# Patient Record
Sex: Male | Born: 1965
Health system: Southern US, Community
[De-identification: ages and names within clinical notes are randomized; demographics above are authoritative.]

## PROBLEM LIST (undated history)

## (undated) DIAGNOSIS — K219 Gastro-esophageal reflux disease without esophagitis: Secondary | ICD-10-CM

## (undated) DIAGNOSIS — R0789 Other chest pain: Secondary | ICD-10-CM

## (undated) HISTORY — PX: OTHER SURGICAL HISTORY: SHX169

## (undated) HISTORY — DX: Other chest pain: R07.89

---

## 2007-03-26 ENCOUNTER — Ambulatory Visit: Payer: Self-pay | Admitting: Internal Medicine

## 2009-11-24 ENCOUNTER — Emergency Department: Payer: Self-pay

## 2009-11-29 ENCOUNTER — Ambulatory Visit: Payer: Self-pay | Admitting: General Practice

## 2012-08-19 ENCOUNTER — Emergency Department: Payer: Self-pay | Admitting: Emergency Medicine

## 2012-08-19 LAB — CBC
HCT: 42.4 % (ref 40.0–52.0)
MCH: 29 pg (ref 26.0–34.0)
MCHC: 34.8 g/dL (ref 32.0–36.0)
MCV: 83 fL (ref 80–100)
Platelet: 210 10*3/uL (ref 150–440)
RBC: 5.09 10*6/uL (ref 4.40–5.90)

## 2012-08-19 LAB — URINALYSIS, COMPLETE
Bilirubin,UR: NEGATIVE
Glucose,UR: NEGATIVE mg/dL (ref 0–75)
Ketone: NEGATIVE
Leukocyte Esterase: NEGATIVE
Nitrite: NEGATIVE
Ph: 6 (ref 4.5–8.0)
Squamous Epithelial: <1

## 2012-08-19 LAB — BASIC METABOLIC PANEL
BUN: 15 mg/dL (ref 7–18)
Creatinine: 1.2 mg/dL (ref 0.60–1.30)
EGFR (Non-African Amer.): >60
Osmolality: 270 (ref 275–301)
Potassium: 3.1 mmol/L — ABNORMAL LOW (ref 3.5–5.1)
Sodium: 134 mmol/L — ABNORMAL LOW (ref 136–145)

## 2012-08-19 LAB — HEPATIC FUNCTION PANEL A (ARMC)
Bilirubin,Total: 0.5 mg/dL (ref 0.2–1.0)
SGOT(AST): 46 U/L — ABNORMAL HIGH (ref 15–37)
SGPT (ALT): 70 U/L (ref 12–78)
Total Protein: 7.8 g/dL (ref 6.4–8.2)

## 2012-08-19 LAB — LIPASE, BLOOD: Lipase: 64 U/L — ABNORMAL LOW (ref 73–393)

## 2012-10-15 ENCOUNTER — Ambulatory Visit: Payer: Self-pay | Admitting: Gastroenterology

## 2017-02-28 DIAGNOSIS — J069 Acute upper respiratory infection, unspecified: Secondary | ICD-10-CM | POA: Diagnosis not present

## 2017-03-17 DIAGNOSIS — B9689 Other specified bacterial agents as the cause of diseases classified elsewhere: Secondary | ICD-10-CM | POA: Diagnosis not present

## 2017-03-17 DIAGNOSIS — J019 Acute sinusitis, unspecified: Secondary | ICD-10-CM | POA: Diagnosis not present

## 2018-02-21 ENCOUNTER — Emergency Department: Payer: 59

## 2018-02-21 ENCOUNTER — Other Ambulatory Visit: Payer: Self-pay

## 2018-02-21 ENCOUNTER — Observation Stay
Admission: EM | Admit: 2018-02-21 | Discharge: 2018-02-22 | Disposition: A | Discharge status: Home / Self Care | Payer: 59 | Attending: Internal Medicine | Admitting: Internal Medicine

## 2018-02-21 ENCOUNTER — Encounter: Payer: Self-pay | Admitting: Emergency Medicine

## 2018-02-21 DIAGNOSIS — H538 Other visual disturbances: Secondary | ICD-10-CM | POA: Diagnosis not present

## 2018-02-21 DIAGNOSIS — H8110 Benign paroxysmal vertigo, unspecified ear: Secondary | ICD-10-CM | POA: Insufficient documentation

## 2018-02-21 DIAGNOSIS — R42 Dizziness and giddiness: Secondary | ICD-10-CM | POA: Diagnosis not present

## 2018-02-21 DIAGNOSIS — R0789 Other chest pain: Secondary | ICD-10-CM | POA: Diagnosis not present

## 2018-02-21 DIAGNOSIS — R079 Chest pain, unspecified: Secondary | ICD-10-CM | POA: Diagnosis present

## 2018-02-21 DIAGNOSIS — K219 Gastro-esophageal reflux disease without esophagitis: Secondary | ICD-10-CM | POA: Insufficient documentation

## 2018-02-21 DIAGNOSIS — I498 Other specified cardiac arrhythmias: Secondary | ICD-10-CM | POA: Insufficient documentation

## 2018-02-21 DIAGNOSIS — Z79899 Other long term (current) drug therapy: Secondary | ICD-10-CM | POA: Diagnosis not present

## 2018-02-21 HISTORY — DX: Gastro-esophageal reflux disease without esophagitis: K21.9

## 2018-02-21 LAB — BASIC METABOLIC PANEL
Anion gap: 7 (ref 5–15)
BUN: 14 mg/dL (ref 6–20)
CO2: 25 mmol/L (ref 22–32)
Calcium: 8.8 mg/dL — ABNORMAL LOW (ref 8.9–10.3)
Chloride: 106 mmol/L (ref 98–111)
Creatinine, Ser: 0.82 mg/dL (ref 0.61–1.24)
GFR calc Af Amer: >60 mL/min (ref >60)
GFR calc non Af Amer: >60 mL/min (ref >60)
GLUCOSE: 102 mg/dL — AB (ref 70–99)
Potassium: 4 mmol/L (ref 3.5–5.1)
Sodium: 138 mmol/L (ref 135–145)

## 2018-02-21 LAB — CBC
HCT: 45.5 % (ref 39.0–52.0)
Hemoglobin: 15.2 g/dL (ref 13.0–17.0)
MCH: 29 pg (ref 26.0–34.0)
MCHC: 33.4 g/dL (ref 30.0–36.0)
MCV: 86.8 fL (ref 80.0–100.0)
Platelets: 270 10*3/uL (ref 150–400)
RBC: 5.24 MIL/uL (ref 4.22–5.81)
RDW: 13.4 % (ref 11.5–15.5)
WBC: 6.7 10*3/uL (ref 4.0–10.5)
nRBC: 0 % (ref 0.0–0.2)

## 2018-02-21 LAB — DIFFERENTIAL
Abs Immature Granulocytes: 0.02 10*3/uL (ref 0.00–0.07)
Basophils Absolute: 0.1 10*3/uL (ref 0.0–0.1)
Basophils Relative: 1 %
Eosinophils Absolute: 0.3 10*3/uL (ref 0.0–0.5)
Eosinophils Relative: 4 %
Immature Granulocytes: 0 %
LYMPHS ABS: 2.1 10*3/uL (ref 0.7–4.0)
Lymphocytes Relative: 32 %
Monocytes Absolute: 0.4 10*3/uL (ref 0.1–1.0)
Monocytes Relative: 6 %
Neutro Abs: 3.9 10*3/uL (ref 1.7–7.7)
Neutrophils Relative %: 57 %

## 2018-02-21 LAB — TROPONIN I: Troponin I: <0.03 ng/mL (ref <0.03)

## 2018-02-21 LAB — FIBRIN DERIVATIVES D-DIMER (ARMC ONLY): Fibrin derivatives D-dimer (ARMC): 111.29 ng/mL (FEU) (ref 0.00–499.00)

## 2018-02-21 LAB — PROTIME-INR
INR: 0.95
Prothrombin Time: 12.6 seconds (ref 11.4–15.2)

## 2018-02-21 LAB — APTT: aPTT: 35 seconds (ref 24–36)

## 2018-02-21 LAB — GLUCOSE, CAPILLARY: Glucose-Capillary: 80 mg/dL (ref 70–99)

## 2018-02-21 MED ORDER — MORPHINE SULFATE (PF) 2 MG/ML IV SOLN: 2.0000 mg | INTRAVENOUS | Status: DC | PRN

## 2018-02-21 MED ORDER — ACETAMINOPHEN 325 MG PO TABS: 650.0000 mg | ORAL_TABLET | ORAL | Status: DC | PRN

## 2018-02-21 MED ORDER — ONDANSETRON HCL 4 MG/2ML IJ SOLN: 4.0000 mg | Freq: Four times a day (QID) | INTRAMUSCULAR | Status: DC | PRN

## 2018-02-21 MED ORDER — ASPIRIN 81 MG PO CHEW
324.0000 mg | CHEWABLE_TABLET | Freq: Once | ORAL | Status: AC
  Administered 2018-02-21: 324 mg via ORAL
  Filled 2018-02-21: qty 4

## 2018-02-21 MED ORDER — ENOXAPARIN SODIUM 40 MG/0.4ML ~~LOC~~ SOLN: 40.0000 mg | SUBCUTANEOUS | Status: DC

## 2018-02-21 MED ORDER — PANTOPRAZOLE SODIUM 40 MG PO TBEC: 40.0000 mg | DELAYED_RELEASE_TABLET | Freq: Every day | ORAL | Status: DC

## 2018-02-21 NOTE — Progress Notes (Signed)
Family Meeting Note  Advance Directive:yes  Today a meeting took place with the Patient.     The following clinical team members were present during this meeting:MD  The following were discussed:Patient's diagnosis: 3-day history of chest pain associated with dizziness, GERD, will be admitted to the hospital.  Will check orthostatics and patient will be monitored on telemetry.  The plan of care discussed in detail with the patient he verbalized understanding of the plan.  Wife at bedside.     Patient's progosis: > 12 months and Goals for treatment: Full Code  Wife healthcare power of attorney  Additional follow-up to be provided: Hospitalist and cardiology  Time spent during discussion:17 min  Ramonita Lab, MD

## 2018-02-21 NOTE — ED Triage Notes (Signed)
Pt in via POV with complaints of generalized chest pressure x 3 days, developing some associated dizziness last night and again today.  Pt also reports some blurred vision upon waking this morning which has not resolved.  Pt othewise neurologically intact.  Denies hx of vertigo.  Vitals WDL.

## 2018-02-21 NOTE — H&P (Addendum)
[facility] - [place] at [facility]   PATIENT NAME: Russell Flynn    MR#:  [id number]  DATE OF BIRTH:  May 28, 1965  DATE OF ADMISSION:  02/21/2018  PRIMARY CARE PHYSICIAN: Lauro Regulus, MD   REQUESTING/REFERRING PHYSICIAN: Rockne Menghini, MD  CHIEF COMPLAINT:  Chest pain  HISTORY OF PRESENT ILLNESS:  Russell Flynn  is a 53 y.o. male with a known history of chest pain for the past 3 days.  Has history of GERD and takes omeprazole twice a day.  Patient reports the pain in the chest started 3 days ago on the left side and radiating to the right side feels like pressure but not associated with any shortness of breath but feels dizzy denies any syncopal episodes.  No sick contacts.  Initial troponin less than 0.03.  CT head is negative chest x-ray negative.  Hospitalist team is called admit the patient.  Wife at bedside  PAST MEDICAL HISTORY:  GERD  PAST SURGICAL HISTOIRY:  History reviewed. No pertinent surgical history.  SOCIAL HISTORY:   Social History   Tobacco Use  . Smoking status: Never Smoker  . Smokeless tobacco: Never Used  Substance Use Topics  . Alcohol use: Never    Frequency: Never    FAMILY HISTORY:  No family history on file.  DRUG ALLERGIES:  No Known Allergies  REVIEW OF SYSTEMS:  CONSTITUTIONAL: No fever, fatigue or weakness.  EYES: No blurred or double vision.  EARS, NOSE, AND THROAT: No tinnitus or ear pain.  RESPIRATORY: No cough, shortness of breath, wheezing or hemoptysis.  CARDIOVASCULAR: Reports 3-day history of left-sided chest pain, dizziness, denies orthopnea, edema.  GASTROINTESTINAL: No nausea, vomiting, diarrhea or abdominal pain.  GENITOURINARY: No dysuria, hematuria.  ENDOCRINE: No polyuria, nocturia,  HEMATOLOGY: No anemia, easy bruising or bleeding SKIN: No rash or lesion. MUSCULOSKELETAL: No joint pain or arthritis.   NEUROLOGIC: No tingling, numbness, weakness.  PSYCHIATRY: No anxiety or  depression.   MEDICATIONS AT HOME:   Prior to Admission medications   Not on File      VITAL SIGNS:  Blood pressure (!) 132/114, pulse 61, temperature 98.1 F (36.7 C), temperature source Oral, resp. rate 16, height 5\' 3"  (1.6 m), weight 64.4 kg, SpO2 100 %.  PHYSICAL EXAMINATION:  GENERAL:  54 y.o.-year-old patient lying in the bed with no acute distress.  EYES: Pupils equal, round, reactive to light and accommodation. No scleral icterus. Extraocular muscles intact.  HEENT: Head atraumatic, normocephalic. Oropharynx and nasopharynx clear.  NECK:  Supple, no jugular venous distention. No thyroid enlargement, no tenderness.  LUNGS: Normal breath sounds bilaterally, no wheezing, rales,rhonchi or crepitation. No use of accessory muscles of respiration.  CARDIOVASCULAR: S1, S2 normal. No murmurs, rubs, or gallops.  No reproducible chest tenderness on palpation ABDOMEN: Soft, nontender, nondistended. Bowel sounds present.   EXTREMITIES: No pedal edema, cyanosis, or clubbing.  NEUROLOGIC: Cranial nerves II through XII are intact. Muscle strength 5/5 in all extremities. Sensation intact. Gait not checked.  PSYCHIATRIC: The patient is alert and oriented x 3.  SKIN: No obvious rash, lesion, or ulcer.   LABORATORY PANEL:   CBC Recent Labs  Lab 02/21/18 1340  WBC 6.7  HGB 15.2  HCT 45.5  PLT 270   ------------------------------------------------------------------------------------------------------------------  Chemistries  Recent Labs  Lab 02/21/18 1340  NA 138  K 4.0  CL 106  CO2 25  GLUCOSE 102*  BUN 14  CREATININE 0.82  CALCIUM 8.8*   ------------------------------------------------------------------------------------------------------------------  Cardiac Enzymes Recent  Labs  Lab 02/21/18 1717  TROPONINI <0.03   ------------------------------------------------------------------------------------------------------------------  RADIOLOGY:  Dg Chest 2  View  Result Date: 02/21/2018 CLINICAL DATA:  Patient with complaints of generalized chest pressure x 3 days, developing some associated dizziness last night and again today. Pt also reports some blurred vision upon waking this morning which has not resolved. EXAM: CHEST - 2 VIEW COMPARISON:  None. FINDINGS: The heart size and mediastinal contours are within normal limits. Both lungs are clear. The visualized skeletal structures are unremarkable. IMPRESSION: No active cardiopulmonary disease. Electronically Signed   By: Norva Pavlov M.D.   On: 02/21/2018 14:44   Ct Head Wo Contrast  Result Date: 02/21/2018 CLINICAL DATA:  53 y/o M; dizziness and bilateral blurred vision since last night. EXAM: CT HEAD WITHOUT CONTRAST TECHNIQUE: Contiguous axial images were obtained from the base of the skull through the vertex without intravenous contrast. COMPARISON:  None. FINDINGS: Brain: No evidence of acute infarction, hemorrhage, hydrocephalus, extra-axial collection or mass lesion/mass effect. Vascular: No hyperdense vessel or unexpected calcification. Skull: Normal. Negative for fracture or focal lesion. Sinuses/Orbits: No acute finding. Other: None. IMPRESSION: No acute intracranial abnormality identified. Unremarkable CT of the head. Electronically Signed   By: Mitzi Hansen M.D.   On: 02/21/2018 14:42    EKG:   Orders placed or performed during the hospital encounter of 02/21/18  . EKG 12-Lead  . EKG 12-Lead  . ED EKG  . ED EKG    IMPRESSION AND PLAN:    #Chest pain for 3 days Admitted telemetry Cycle cardiac biomarkers Aspirin once daily, nitroglycerin as needed, morphine for severe pain Oxygen via nasal cannula to keep pulse ox greater than 93% Monitor patient on telemetry Check fasting lipid panel We will start low-dose Toprol if blood pressure is stable at this point patient's blood pressure is very soft   #Dizziness Need orthostatic blood pressures and gentle hydration  with IV fluids  #GERD PPI  DVT prophylaxis with IV Lovenox  All the records are reviewed and case discussed with ED provider. Management plans discussed with the patient, family and they are in agreement.  CODE STATUS: fc   TOTAL TIME TAKING CARE OF THIS PATIENT: 39   minutes.   Note: This dictation was prepared with Dragon dictation along with smaller phrase technology. Any transcriptional errors that result from this process are unintentional.  Ramonita Lab M.D on 02/21/2018 at 7:13 PM  Between 7am to 6pm - Pager - [phone]  After 6pm go to [email or web address] - password [email or web address] [facility]  [place] [facility]  Office  [phone]  CC: Primary care physician; Lauro Regulus, MD

## 2018-02-21 NOTE — ED Provider Notes (Signed)
[facility] Emergency Department Provider Note  ____________________________________________  Time seen: Approximately 5:16 PM  I have reviewed the triage vital signs and the nursing notes.   HISTORY  Chief Complaint Chest Pain    HPI BURLEN MACARI is a 53 y.o. male, otherwise healthy, presenting w/ exertional CP.  The pt reports that for the past 3d, he has been having a L sided, nonradiating chest pressure that only occurs with exertion and resolves with rest.  He has no associated diaphoresis, sob, n/v, palpitations.  He does feel lightheaded w/o syncope when he has the pain.  He has mild pain w/ deep breaths.  Episodes usually last 2 minutes.  No cough/cold sx's.  No fever/chills.  Never had risk stratification study or cath.  Denies tobacco or cocaine.  No FH of early CAD or blood clots.    History reviewed. No pertinent past medical history.  There are no active problems to display for this patient.   History reviewed. No pertinent surgical history.    Allergies Patient has no known allergies.  No family history on file.  Social History Social History   Tobacco Use  . Smoking status: Never Smoker  . Smokeless tobacco: Never Used  Substance Use Topics  . Alcohol use: Never    Frequency: Never  . Drug use: Never    Review of Systems Constitutional: No fever/chills. + lightheadedness w/o syncope. Eyes: No visual changes. ENT: No sore throat. No congestion or rhinorrhea. Cardiovascular: + chest pain. Denies palpitations. Respiratory: Denies shortness of breath.  No cough. Gastrointestinal: No abdominal pain.  No nausea, no vomiting.  No diarrhea.  No constipation. Genitourinary: Negative for dysuria. Musculoskeletal: Negative for back pain. No LE swelling or calf pain. Skin: Negative for rash. Neurological: Negative for headaches. No focal numbness, tingling or weakness.      ____________________________________________   PHYSICAL EXAM:  VITAL SIGNS: ED Triage Vitals  Enc Vitals Group     BP 02/21/18 1331 108/80     Pulse Rate 02/21/18 1331 77     Resp 02/21/18 1331 16     Temp 02/21/18 1331 98.1 F (36.7 C)     Temp Source 02/21/18 1331 Oral     SpO2 02/21/18 1331 98 %     Weight 02/21/18 1332 142 lb (64.4 kg)     Height 02/21/18 1332 5\' 3"  (1.6 m)     Head Circumference --      Peak Flow --      Pain Score 02/21/18 1331 4     Pain Loc --      Pain Edu? --      Excl. in GC? --     Constitutional: Alert and oriented. Answers questions appropriately. Eyes: Conjunctivae are normal.  EOMI. No scleral icterus. Head: Atraumatic. Nose: No congestion/rhinnorhea. Mouth/Throat: Mucous membranes are moist.  Neck: No stridor.  Supple.  No JVD, no meningismus. Cardiovascular: Normal rate, regular rhythm. No murmurs, rubs or gallops.  Respiratory: Normal respiratory effort.  No accessory muscle use or retractions. Lungs CTAB.  No wheezes, rales or ronchi. Gastrointestinal: Soft, nontender and nondistended.  No guarding or rebound.  No peritoneal signs. Musculoskeletal: No LE edema. No ttp in the calves or palpable cords.  Negative Homan's sign. Neurologic:  A&Ox3.  Speech is clear.  Face and smile are symmetric.  EOMI.  Moves all extremities well. Skin:  Skin is warm, dry and intact. No rash noted. Psychiatric: Mood and affect are normal. Speech and behavior are  normal.  Normal judgement  ____________________________________________   LABS (all labs ordered are listed, but only abnormal results are displayed)  Labs Reviewed  BASIC METABOLIC PANEL - Abnormal; Notable for the following components:      Result Value   Glucose, Bld 102 (*)    Calcium 8.8 (*)    All other components within normal limits  CBC  TROPONIN I  PROTIME-INR  APTT  DIFFERENTIAL  GLUCOSE, CAPILLARY  TROPONIN I  FIBRIN DERIVATIVES D-DIMER ([facility] ONLY)    ____________________________________________  EKG  This EKG is performed at 1323 and interpreted by me; rate 70, NSR, nl intervals, no STEMI, nonspecific twave inversion V1. ____________________________________________  RADIOLOGY  Dg Chest 2 View  Result Date: 02/21/2018 CLINICAL DATA:  Patient with complaints of generalized chest pressure x 3 days, developing some associated dizziness last night and again today. Pt also reports some blurred vision upon waking this morning which has not resolved. EXAM: CHEST - 2 VIEW COMPARISON:  None. FINDINGS: The heart size and mediastinal contours are within normal limits. Both lungs are clear. The visualized skeletal structures are unremarkable. IMPRESSION: No active cardiopulmonary disease. Electronically Signed   By: Norva Pavlov M.D.   On: 02/21/2018 14:44   Ct Head Wo Contrast  Result Date: 02/21/2018 CLINICAL DATA:  53 y/o M; dizziness and bilateral blurred vision since last night. EXAM: CT HEAD WITHOUT CONTRAST TECHNIQUE: Contiguous axial images were obtained from the base of the skull through the vertex without intravenous contrast. COMPARISON:  None. FINDINGS: Brain: No evidence of acute infarction, hemorrhage, hydrocephalus, extra-axial collection or mass lesion/mass effect. Vascular: No hyperdense vessel or unexpected calcification. Skull: Normal. Negative for fracture or focal lesion. Sinuses/Orbits: No acute finding. Other: None. IMPRESSION: No acute intracranial abnormality identified. Unremarkable CT of the head. Electronically Signed   By: Mitzi Hansen M.D.   On: 02/21/2018 14:42    ____________________________________________   PROCEDURES  Procedure(s) performed: None  Procedures  Critical Care performed: No ____________________________________________   INITIAL IMPRESSION / ASSESSMENT AND PLAN / ED COURSE  Pertinent labs & imaging results that were available during my care of the patient were reviewed by me  and considered in my medical decision making (see chart for details).  53 y.o. M otherwise healthy presenting w/ exertional CP w/ lightheadedness for 3d.  Overall, the pt is hemodynamically stable with a reassuring EKG w/o ischemia or arrhythmia and a negative troponin.  However, his history is concerning for exertional angina/unstable angina and the patient has never had a risk stratification study.  A ddimer is pending.  The pt will receive asa, heparin is not indicated as the pt is chest pain free at this time.  I have spoken with the cardiologist on call, who recommends admission for inpatient stress test.  The patient is in agreement with the plan.     ____________________________________________  FINAL CLINICAL IMPRESSION(S) / ED DIAGNOSES  Final diagnoses:  Exertional chest pain  Lightheadedness         NEW MEDICATIONS STARTED DURING THIS VISIT:  New Prescriptions   No medications on file      Rockne Menghini, MD 02/21/18 1728

## 2018-02-21 NOTE — ED Notes (Signed)
ED TO INPATIENT HANDOFF REPORT  Name/Age/Gender Russell Flynn 53 y.o. male  Code Status   Home/SNF/Other Patient from home.   Chief Complaint chest pain, dizziness from [employer]  Level of Care/Admitting Diagnosis ED Disposition    ED Disposition Condition Comment   Admit  Hospital Area: [facility] [[id number]]  Level of Care: Telemetry [5]  Diagnosis: Chest pain [161096][[id number]]  Admitting Physician: Ramonita LabGOURU, ARUNA [[id number]]  Attending Physician: Ramonita LabGOURU, ARUNA [[id number]]  Bed request comments: 2a  PT Class (Do Not Modify): Observation [104]  PT Acc Code (Do Not Modify): Observation [10022]       Medical History History reviewed. No pertinent past medical history.  Allergies No Known Allergies  IV Location/Drains/Wounds Patient Lines/Drains/Airways Status   Active Line/Drains/Airways    Name:   Placement date:   Placement time:   Site:   Days:   Peripheral IV 02/21/18 Left Antecubital   02/21/18    1720    Antecubital   less than 1          Labs/Imaging Results for orders placed or performed during the hospital encounter of 02/21/18 (from the past 48 hour(s))  Basic metabolic panel     Status: Abnormal   Collection Time: 02/21/18  1:40 PM  Result Value Ref Range   Sodium 138 135 - 145 mmol/L   Potassium 4.0 3.5 - 5.1 mmol/L   Chloride 106 98 - 111 mmol/L   CO2 25 22 - 32 mmol/L   Glucose, Bld 102 (H) 70 - 99 mg/dL   BUN 14 6 - 20 mg/dL   Creatinine, Ser 0.450.82 0.61 - 1.24 mg/dL   Calcium 8.8 (L) 8.9 - 10.3 mg/dL   GFR calc non Af Amer >60 >60 mL/min   GFR calc Af Amer >60 >60 mL/min   Anion gap 7 5 - 15    Comment: Performed at [facility], [address] [place], [place] [address]  CBC     Status: None   Collection Time: 02/21/18  1:40 PM  Result Value Ref Range   WBC 6.7 4.0 - 10.5 K/uL   RBC 5.24 4.22 - 5.81 MIL/uL   Hemoglobin 15.2 13.0 - 17.0 g/dL   HCT 19.145.5 47.839.0 - 29.552.0 %   MCV 86.8 80.0 - 100.0 fL   MCH 29.0 26.0 - 34.0 pg   MCHC  33.4 30.0 - 36.0 g/dL   RDW 62.113.4 30.811.5 - 65.715.5 %   Platelets 270 150 - 400 K/uL   nRBC 0.0 0.0 - 0.2 %    Comment: Performed at [facility], [address] [place], [place] [address]  Troponin I - ONCE - STAT     Status: None   Collection Time: 02/21/18  1:40 PM  Result Value Ref Range   Troponin I <0.03 <0.03 ng/mL    Comment: Performed at [facility], [address] [place], [place] [address]  Protime-INR     Status: None   Collection Time: 02/21/18  1:40 PM  Result Value Ref Range   Prothrombin Time 12.6 11.4 - 15.2 seconds   INR 0.95     Comment: Performed at [facility], [address] [place], [place] [address]  APTT     Status: None   Collection Time: 02/21/18  1:40 PM  Result Value Ref Range   aPTT 35 24 - 36 seconds    Comment: Performed at [facility], [address] [place], [place] [address]  Differential  Status: None   Collection Time: 02/21/18  1:40 PM  Result Value Ref Range   Neutrophils Relative % 57 %   Neutro Abs 3.9 1.7 - 7.7 K/uL   Lymphocytes Relative 32 %   Lymphs Abs 2.1 0.7 - 4.0 K/uL   Monocytes Relative 6 %   Monocytes Absolute 0.4 0.1 - 1.0 K/uL   Eosinophils Relative 4 %   Eosinophils Absolute 0.3 0.0 - 0.5 K/uL   Basophils Relative 1 %   Basophils Absolute 0.1 0.0 - 0.1 K/uL   Immature Granulocytes 0 %   Abs Immature Granulocytes 0.02 0.00 - 0.07 K/uL    Comment: Performed at [facility], [address] [place], [place] [address]  Glucose, capillary     Status: None   Collection Time: 02/21/18  1:43 PM  Result Value Ref Range   Glucose-Capillary 80 70 - 99 mg/dL  Troponin I - ONCE - STAT     Status: None   Collection Time: 02/21/18  5:17 PM  Result Value Ref Range   Troponin I <0.03 <0.03 ng/mL    Comment: Performed at [facility], [address] [place], [place] [address]  Fibrin derivatives D-Dimer [facility] only)     Status: None   Collection Time: 02/21/18  5:21 PM   Result Value Ref Range   Fibrin derivatives D-dimer ([facility]) 111.29 0.00 - 499.00 ng/mL (FEU)    Comment: (NOTE) <> Exclusion of Venous Thromboembolism (VTE) - OUTPATIENT ONLY   (Emergency Department or [place])   0-499 ng/ml (FEU): With a low to intermediate pretest probability                      for VTE this test result excludes the diagnosis                      of VTE.   >499 ng/ml (FEU) : VTE not excluded; additional work up for VTE is                      required. <> Testing on Inpatients and Evaluation of Disseminated Intravascular   Coagulation (DIC) Reference Range:   0-499 ng/ml (FEU) Performed at [facility], [address] [place], [place] [address]    Dg Chest 2 View  Result Date: 02/21/2018 CLINICAL DATA:  Patient with complaints of generalized chest pressure x 3 days, developing some associated dizziness last night and again today. Pt also reports some blurred vision upon waking this morning which has not resolved. EXAM: CHEST - 2 VIEW COMPARISON:  None. FINDINGS: The heart size and mediastinal contours are within normal limits. Both lungs are clear. The visualized skeletal structures are unremarkable. IMPRESSION: No active cardiopulmonary disease. Electronically Signed   By: Norva Pavlov M.D.   On: 02/21/2018 14:44   Ct Head Wo Contrast  Result Date: 02/21/2018 CLINICAL DATA:  53 y/o M; dizziness and bilateral blurred vision since last night. EXAM: CT HEAD WITHOUT CONTRAST TECHNIQUE: Contiguous axial images were obtained from the base of the skull through the vertex without intravenous contrast. COMPARISON:  None. FINDINGS: Brain: No evidence of acute infarction, hemorrhage, hydrocephalus, extra-axial collection or mass lesion/mass effect. Vascular: No hyperdense vessel or unexpected calcification. Skull: Normal. Negative for fracture or focal lesion. Sinuses/Orbits: No acute finding. Other: None. IMPRESSION: No acute intracranial abnormality identified.  Unremarkable CT of the head. Electronically Signed   By: Mitzi Hansen M.D.   On: 02/21/2018 14:42  Pending Labs [employer] (From admission, onward)    Start     Ordered   Signed and Held  HIV antibody (Routine Testing)  Once,   R     Signed and Held   Signed and Held  Troponin I - Now Then Q3H  Now then every 3 hours,   TIMED     Signed and Held   Signed and Held  CBC  (enoxaparin (LOVENOX)    CrCl >/= 30 ml/min)  Once,   R    Comments:  Baseline for enoxaparin therapy IF NOT ALREADY DRAWN.  Notify MD if PLT < 100 K.    Signed and Held   Signed and Held  Creatinine, serum  (enoxaparin (LOVENOX)    CrCl >/= 30 ml/min)  Once,   R    Comments:  Baseline for enoxaparin therapy IF NOT ALREADY DRAWN.    Signed and Held   Signed and Held  Creatinine, serum  (enoxaparin (LOVENOX)    CrCl >/= 30 ml/min)  Weekly,   R    Comments:  while on enoxaparin therapy    Signed and Held          Vitals/Pain Today's Vitals   02/21/18 1332 02/21/18 1711 02/21/18 1929 02/21/18 2305  BP:  (!) 132/114 106/79 (!) 110/58  Pulse:  61 74 68  Resp:  16 20 17   Temp:   98.9 F (37.2 C) 98.6 F (37 C)  TempSrc:   Oral Oral  SpO2:  100% 100% 99%  Weight: 64.4 kg     Height: 5\' 3"  (1.6 m)     PainSc:  0-No pain 0-No pain 0-No pain    Isolation Precautions No active isolations  Medications Medications  aspirin chewable tablet 324 mg (324 mg Oral Given 02/21/18 1737)    Mobility Ambulatory

## 2018-02-21 NOTE — ED Notes (Signed)
Report called and given to receiving nurse.  

## 2018-02-22 ENCOUNTER — Encounter: Payer: Self-pay | Admitting: Internal Medicine

## 2018-02-22 ENCOUNTER — Observation Stay (HOSPITAL_BASED_OUTPATIENT_CLINIC_OR_DEPARTMENT_OTHER): Payer: 59

## 2018-02-22 ENCOUNTER — Observation Stay (HOSPITAL_BASED_OUTPATIENT_CLINIC_OR_DEPARTMENT_OTHER)
Admit: 2018-02-22 | Discharge: 2018-02-22 | Disposition: A | Discharge status: Home / Self Care | Payer: 59 | Attending: Internal Medicine | Admitting: Internal Medicine

## 2018-02-22 DIAGNOSIS — R079 Chest pain, unspecified: Secondary | ICD-10-CM

## 2018-02-22 DIAGNOSIS — R0789 Other chest pain: Secondary | ICD-10-CM | POA: Diagnosis not present

## 2018-02-22 DIAGNOSIS — H8110 Benign paroxysmal vertigo, unspecified ear: Secondary | ICD-10-CM | POA: Diagnosis not present

## 2018-02-22 DIAGNOSIS — R42 Dizziness and giddiness: Secondary | ICD-10-CM | POA: Diagnosis not present

## 2018-02-22 DIAGNOSIS — H6690 Otitis media, unspecified, unspecified ear: Secondary | ICD-10-CM | POA: Diagnosis not present

## 2018-02-22 LAB — NM MYOCAR MULTI W/SPECT W/WALL MOTION / EF
LV dias vol: 72 mL (ref 62–150)
LV sys vol: 19 mL
Peak HR: 108 {beats}/min
Rest HR: 72 {beats}/min
TID: 0.89

## 2018-02-22 LAB — ECHOCARDIOGRAM COMPLETE
Height: 64 in
Weight: 2240 oz

## 2018-02-22 LAB — TROPONIN I: Troponin I: <0.03 ng/mL (ref <0.03)

## 2018-02-22 MED ORDER — AMOXICILLIN-POT CLAVULANATE 875-125 MG PO TABS: 1.0000 | ORAL_TABLET | Freq: Two times a day (BID) | ORAL | 9 refills | Status: DC

## 2018-02-22 MED ORDER — MECLIZINE HCL 25 MG PO TABS
25.0000 mg | ORAL_TABLET | ORAL | Status: AC
  Administered 2018-02-22: 25 mg via ORAL
  Filled 2018-02-22: qty 1

## 2018-02-22 MED ORDER — REGADENOSON 0.4 MG/5ML IV SOLN
0.4000 mg | Freq: Once | INTRAVENOUS | Status: AC
  Administered 2018-02-22: 0.4 mg via INTRAVENOUS

## 2018-02-22 MED ORDER — MECLIZINE HCL 25 MG PO TABS: 25.0000 mg | ORAL_TABLET | Freq: Three times a day (TID) | ORAL | 0 refills | Status: DC | PRN

## 2018-02-22 MED ORDER — MECLIZINE HCL 25 MG PO TABS
25.0000 mg | ORAL_TABLET | Freq: Three times a day (TID) | ORAL | Status: DC
  Filled 2018-02-22 (×2): qty 1

## 2018-02-22 MED ORDER — TECHNETIUM TC 99M TETROFOSMIN IV KIT
10.8600 | PACK | Freq: Once | INTRAVENOUS | Status: AC | PRN
  Administered 2018-02-22: 10.86 via INTRAVENOUS

## 2018-02-22 MED ORDER — AMOXICILLIN-POT CLAVULANATE 875-125 MG PO TABS: 1.0000 | ORAL_TABLET | Freq: Two times a day (BID) | ORAL | Status: DC

## 2018-02-22 MED ORDER — TECHNETIUM TC 99M TETROFOSMIN IV KIT
30.4470 | PACK | Freq: Once | INTRAVENOUS | Status: AC | PRN
  Administered 2018-02-22: 30.447 via INTRAVENOUS

## 2018-02-22 MED ORDER — SODIUM CHLORIDE 0.9 % IV BOLUS
500.0000 mL | Freq: Once | INTRAVENOUS | Status: AC
  Administered 2018-02-22: 500 mL via INTRAVENOUS

## 2018-02-22 NOTE — Consult Note (Signed)
Cardiology Consultation:   Patient ID: Russell Flynn MRN: [id number]; DOB: Jan 19, 1965  Admit date: 02/21/2018 Date of Consult: 02/22/2018  Primary Care Provider: Lauro Regulus, MD Primary Cardiologist: New - Consult by Pallie Swigert Primary Electrophysiologist:  None    Patient Profile:   Russell Flynn is a 53 y.o. male with a hx of GERD who is being seen today for the evaluation of chest pain and dizziness at the request of Dr. Amado Coe.  History of Present Illness:   Russell Flynn reports developing intermittent chest pain on Saturday (5 days ago).  He describes it is a pressure-like sensation that seems to be exacerbated by climbing stairs, though it also occurs randomly at rest and with other activities.  He notes associated diaphoresis.  He has also been dizzy at time, sometimes feeling like he might black out and at other times feeling like things are spinning around him.  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  He has not had any trauma.  He reports an unintentional weight loss of 5 pounds over the last month.  He denies shortness of breath, palpitations, orthopnea, PND, and edema.  He current reports 4/10 chest pressure (it has been up to 8/10 in intensity since the pain began last weekend).  Russell Flynn reports chest pain many years ago and underwent stress testing > 10 years ago.  He reports that it was normal.  Orthostatic VS in the ED were negative.  Past Medical History:  Diagnosis Date  . GERD (gastroesophageal reflux disease)     History reviewed. No pertinent surgical history.   Home Medications:  Prior to Admission medications   Not on File    Inpatient Medications: Scheduled Meds: . enoxaparin (LOVENOX) injection  40 mg Subcutaneous Q24H  . pantoprazole  40 mg Oral Daily   Continuous Infusions:  PRN Meds: acetaminophen, morphine injection, ondansetron (ZOFRAN) IV  Allergies:   No Known Allergies  Social History:   Social  History   Socioeconomic History  . Marital status: Married    Spouse name: Not on file  . Number of children: Not on file  . Years of education: Not on file  . Highest education level: Not on file  Occupational History  . Not on file  Social Needs  . Financial resource strain: Not on file  . Food insecurity:    Worry: Not on file    Inability: Not on file  . Transportation needs:    Medical: Not on file    Non-medical: Not on file  Tobacco Use  . Smoking status: Never Smoker  . Smokeless tobacco: Never Used  Substance and Sexual Activity  . Alcohol use: Yes    Frequency: Never    Comment: 2 glasses of wine per month  . Drug use: Never  . Sexual activity: Not on file  Lifestyle  . Physical activity:    Days per week: Not on file    Minutes per session: Not on file  . Stress: Not on file  Relationships  . Social connections:    Talks on phone: Not on file    Gets together: Not on file    Attends religious service: Not on file    Active member of club or organization: Not on file    Attends meetings of clubs or organizations: Not on file    Relationship status: Not on file  . Intimate partner violence:    Fear of current or ex  partner: Not on file    Emotionally abused: Not on file    Physically abused: Not on file    Forced sexual activity: Not on file  Other Topics Concern  . Not on file  Social History Narrative  . Not on file    Family History:    Family History  Problem Relation Age of Onset  . Heart disease Mother        Thayer Jew in the heart requiring surigal repair.     ROS:  Please see the history of present illness.  All other ROS reviewed and negative.     Physical Exam/Data:   Vitals:   02/21/18 2337 02/21/18 2338 02/22/18 0407 02/22/18 0809  BP: 122/87 126/86 102/76 113/80  Pulse: 77 74 67 83  Resp:   20 18  Temp:   98 F (36.7 C) 97.6 F (36.4 C)  TempSrc:   Oral Oral  SpO2: 96% 96% 98% 97%  Weight:      Height:        Intake/Output  Summary (Last 24 hours) at 02/22/2018 0937 Last data filed at 02/22/2018 0407 Gross per 24 hour  Intake -  Output 0 ml  Net 0 ml   Last 3 Weights 02/21/2018 02/21/2018  Weight (lbs) 140 lb 142 lb  Weight (kg) 63.504 kg 64.411 kg     Body mass index is 24.03 kg/m.  General:  Well nourished, well developed, in no acute distress.  His wife is at the bedside HEENT: normal Lymph: no adenopathy Neck: no JVD Endocrine:  No thryomegaly Vascular: No carotid bruits; 2+ radial and pedal pulses bilaterally. Cardiac:  normal S1, S2; RRR; no murmur  Lungs:  clear to auscultation bilaterally, no wheezing, rhonchi or rales  Abd: soft, nontender, no hepatomegaly  Ext: no edema Musculoskeletal:  No deformities, BUE and BLE strength normal and equal Skin: warm and dry  Neuro:  CNs 2-12 intact, no focal abnormalities noted Psych:  Normal affect   EKG:  The EKG was personally reviewed and demonstrates:  NSR without abnormalities. Telemetry:  Telemetry was personally reviewed and demonstrates:  NSR  Relevant CV Studies: None  Laboratory Data:  Chemistry Recent Labs  Lab 02/21/18 1340  NA 138  K 4.0  CL 106  CO2 25  GLUCOSE 102*  BUN 14  CREATININE 0.82  CALCIUM 8.8*  GFRNONAA >60  GFRAA >60  ANIONGAP 7    No results for input(s): PROT, ALBUMIN, AST, ALT, ALKPHOS, BILITOT in the last 168 hours. Hematology Recent Labs  Lab 02/21/18 1340  WBC 6.7  RBC 5.24  HGB 15.2  HCT 45.5  MCV 86.8  MCH 29.0  MCHC 33.4  RDW 13.4  PLT 270   Cardiac Enzymes Recent Labs  Lab 02/21/18 1340 02/21/18 1717 02/21/18 2345  TROPONINI <0.03 <0.03 <0.03   No results for input(s): TROPIPOC in the last 168 hours.  BNPNo results for input(s): BNP, PROBNP in the last 168 hours.  DDimer No results for input(s): DDIMER in the last 168 hours.  Radiology/Studies:  Dg Chest 2 View  Result Date: 02/21/2018 CLINICAL DATA:  Patient with complaints of generalized chest pressure x 3 days, developing  some associated dizziness last night and again today. Pt also reports some blurred vision upon waking this morning which has not resolved. EXAM: CHEST - 2 VIEW COMPARISON:  None. FINDINGS: The heart size and mediastinal contours are within normal limits. Both lungs are clear. The visualized skeletal structures are unremarkable. IMPRESSION: No active cardiopulmonary  disease. Electronically Signed   By: Norva PavlovElizabeth  Brown M.D.   On: 02/21/2018 14:44   Ct Head Wo Contrast  Result Date: 02/21/2018 CLINICAL DATA:  53 y/o M; dizziness and bilateral blurred vision since last night. EXAM: CT HEAD WITHOUT CONTRAST TECHNIQUE: Contiguous axial images were obtained from the base of the skull through the vertex without intravenous contrast. COMPARISON:  None. FINDINGS: Brain: No evidence of acute infarction, hemorrhage, hydrocephalus, extra-axial collection or mass lesion/mass effect. Vascular: No hyperdense vessel or unexpected calcification. Skull: Normal. Negative for fracture or focal lesion. Sinuses/Orbits: No acute finding. Other: None. IMPRESSION: No acute intracranial abnormality identified. Unremarkable CT of the head. Electronically Signed   By: Mitzi HansenLance  Furusawa-Stratton M.D.   On: 02/21/2018 14:42    Assessment and Plan:   Atypical chest pain Pain is most likely MSK or GI in nature, though worsening is noted with climbing stairs.  Russell Flynn does not have any significant cardiac risk factors.  His EKG is normal.  Given that he continues to be dizzy, I recommend proceeding with pharmacologic myocardial perfusion stress test.  If stress test is normal, I recommend empiric treatment for musculoskeletal pain +/- GERD.  Dizziness Most consistent with vertigo.  Orthostatic vital signs in the ED were negative.  Given mother's history of "hole in the heart" and presenting chest pain, we will obtain an echo.  For questions or updates, please contact [facility] Please consult [email or web address] for contact info under  [facility].  Signed, Yvonne Kendallhristopher Shakir Petrosino, MD  02/22/2018 9:37 AM

## 2018-02-22 NOTE — Progress Notes (Signed)
Patient ID: Russell Flynn  Work note  Patient admitted to [facility] on 02/21/2018 and discharged 02/22/2018.  Please excuse from work for 1 week while receiving treatment.  Can return to work on 03/01/2018.  Dr. Alford Highland [phone]

## 2018-02-22 NOTE — Progress Notes (Signed)
*  PRELIMINARY RESULTS* Echocardiogram 2D Echocardiogram has been performed.  Cristela Blue 02/22/2018, 9:38 AM

## 2018-02-22 NOTE — Discharge Summary (Signed)
[employer] [facility] [facility] [place] [facility]   PATIENT NAME: Russell Flynn    MR#:  [id number]  DATE OF BIRTH:  Feb 21, 1965  DATE OF ADMISSION:  02/21/2018 ADMITTING PHYSICIAN: Russell Lab, MD  DATE OF DISCHARGE: 02/22/2018  PRIMARY CARE PHYSICIAN: Russell Regulus, MD    ADMISSION DIAGNOSIS:  Lightheadedness [R42] Exertional chest pain [R07.9]  DISCHARGE DIAGNOSIS:  Active Problems:   Chest pain   SECONDARY DIAGNOSIS:   Past Medical History:  Diagnosis Date  . GERD (gastroesophageal reflux disease)     HOSPITAL COURSE:   1.  Benign positional vertigo.  Likely either otitis media or viral labyrinthitis.  I will give Augmentin course for otitis media.  PRN meclizine.  Out of work for 1 week.  I did give referral to the patient for [facility] if symptoms continue.  No driving for 1 week while dizzy 2.  Chest pain.  Cardiac enzymes negative.  Stress test negative.  Echocardiogram normal.   DISCHARGE CONDITIONS:   Satisfactory  CONSULTS OBTAINED:  Treatment Team:  Lbcardiology, Rounding, MD End, Russell Deer, MD  DRUG ALLERGIES:  No Known Allergies  DISCHARGE MEDICATIONS:   Allergies as of 02/22/2018   No Known Allergies     Medication List    TAKE these medications   amoxicillin-clavulanate 875-125 MG tablet Commonly known as:  AUGMENTIN Take 1 tablet by mouth every 12 (twelve) hours.   meclizine 25 MG tablet Commonly known as:  ANTIVERT Take 1 tablet (25 mg total) by mouth 3 (three) times daily as needed for dizziness.        DISCHARGE INSTRUCTIONS:   Follow-up PMD 5 days Follow-up with cardiology in a few weeks Can follow-up at the [facility] physical therapy [facility] center if symptoms continue.  If you experience worsening of your admission symptoms, develop shortness of breath, life threatening emergency, suicidal or homicidal thoughts you must seek medical attention immediately by calling 911 or calling your  MD immediately  if symptoms less severe.  You Must read complete instructions/literature along with all the possible adverse reactions/side effects for all the Medicines you take and that have been prescribed to you. Take any new Medicines after you have completely understood and accept all the possible adverse reactions/side effects.   Please note  You were cared for by a hospitalist during your hospital stay. If you have any questions about your discharge medications or the care you received while you were in the hospital after you are discharged, you can call the unit and asked to speak with the hospitalist on call if the hospitalist that took care of you is not available. Once you are discharged, your primary care physician will handle any further medical issues. Please note that NO REFILLS for any discharge medications will be authorized once you are discharged, as it is imperative that you return to your primary care physician (or establish a relationship with a primary care physician if you do not have one) for your aftercare needs so that they can reassess your need for medications and monitor your Flynn values.    Today   CHIEF COMPLAINT:   Chief Complaint  Patient presents with  . Chest Pain    HISTORY OF PRESENT ILLNESS:  Russell Flynn  is a 53 y.o. male came in with chest pain and dizziness   VITAL SIGNS:  Blood pressure 116/75, pulse 79, temperature 98.4 F (36.9 C), temperature source Oral, resp. rate 18, height 5\' 4"  (1.626 m),  weight 63.5 kg, SpO2 98 %.   PHYSICAL EXAMINATION:  GENERAL:  53 y.o.-year-old patient lying in the bed with no acute distress.  EYES: Pupils equal, round, reactive to light and accommodation. No scleral icterus. Extraocular muscles intact.  HEENT: Head atraumatic, normocephalic. Oropharynx and nasopharynx clear.  NECK:  Supple, no jugular venous distention. No thyroid enlargement, no tenderness.  LUNGS: Normal breath sounds bilaterally, no  wheezing, rales,rhonchi or crepitation. No use of accessory muscles of respiration.  CARDIOVASCULAR: S1, S2 normal. No murmurs, rubs, or gallops.  ABDOMEN: Soft, non-tender, non-distended. Bowel sounds present. No organomegaly or mass.  EXTREMITIES: No pedal edema, cyanosis, or clubbing.  NEUROLOGIC: Cranial nerves II through XII are intact. Muscle strength 5/5 in all extremities. Sensation intact. Gait not checked.  Patient placed through Epley maneuver and feels dizzy. PSYCHIATRIC: The patient is alert and oriented x 3.  SKIN: No obvious rash, lesion, or ulcer.   DATA REVIEW:   CBC Recent Labs  Flynn 02/21/18 1340  WBC 6.7  HGB 15.2  HCT 45.5  PLT 270    Chemistries  Recent Labs  Flynn 02/21/18 1340  NA 138  K 4.0  CL 106  CO2 25  GLUCOSE 102*  BUN 14  CREATININE 0.82  CALCIUM 8.8*    Cardiac Enzymes Recent Labs  Flynn 02/21/18 2345  TROPONINI <0.03     RADIOLOGY:  Dg Chest 2 View  Result Date: 02/21/2018 CLINICAL DATA:  Patient with complaints of generalized chest pressure x 3 days, developing some associated dizziness last night and again today. Pt also reports some blurred vision upon waking this morning which has not resolved. EXAM: CHEST - 2 VIEW COMPARISON:  None. FINDINGS: The heart size and mediastinal contours are within normal limits. Both lungs are clear. The visualized skeletal structures are unremarkable. IMPRESSION: No active cardiopulmonary disease. Electronically Signed   By: Russell Flynn M.D.   On: 02/21/2018 14:44   Ct Head Wo Contrast  Result Date: 02/21/2018 CLINICAL DATA:  53 y/o M; dizziness and bilateral blurred vision since last night. EXAM: CT HEAD WITHOUT CONTRAST TECHNIQUE: Contiguous axial images were obtained from the base of the skull through the vertex without intravenous contrast. COMPARISON:  None. FINDINGS: Brain: No evidence of acute infarction, hemorrhage, hydrocephalus, extra-axial collection or mass lesion/mass effect. Vascular:  No hyperdense vessel or unexpected calcification. Skull: Normal. Negative for fracture or focal lesion. Sinuses/Orbits: No acute finding. Other: None. IMPRESSION: No acute intracranial abnormality identified. Unremarkable CT of the head. Electronically Signed   By: Mitzi HansenLance  Furusawa-Stratton M.D.   On: 02/21/2018 14:42   Nm Myocar Multi W/spect W/wall Motion / Ef  Result Date: 02/22/2018  Normal pharmacologic myocardial perfusion stress test without ischemia or scar.  The left ventricular ejection fraction is normal by visual estimation and Siemens calculation (>65%).  This is a low risk study.      Management plans discussed with the patient, family and they are in agreement.  CODE STATUS:     Code Status Orders  (From admission, onward)         Start     Ordered   02/21/18 2326  Full code  Continuous     02/21/18 2325        Code Status History    This patient has a current code status but no historical code status.      TOTAL TIME TAKING CARE OF THIS PATIENT: 35 minutes.    Alford Highlandichard Mckell Riecke M.D on 02/22/2018 at 2:10 PM  Between  7am to 6pm - Pager - [phone]  After 6pm go to [email or web address] - password [email or web address]  [employer] [facility] Office  [phone]  CC: Primary care physician; Russell Regulus, MD

## 2018-02-23 LAB — HIV ANTIBODY (ROUTINE TESTING W REFLEX): HIV Screen 4th Generation wRfx: NONREACTIVE

## 2018-02-26 DIAGNOSIS — H8190 Unspecified disorder of vestibular function, unspecified ear: Secondary | ICD-10-CM | POA: Diagnosis not present

## 2018-03-08 NOTE — Progress Notes (Signed)
Office Visit    Patient Name: Russell Flynn Date of Encounter: 03/09/2018  Primary Care Provider:  Lauro Regulus, MD Primary Cardiologist:  Yvonne Kendall, MD  Chief Complaint    53 year old male with a history of GERD who presents for follow-up after recent hospitalization for atypical chest pain  Past Medical History    Past Medical History:  Diagnosis Date  . Atypical chest pain    a. 02/2018 Echo: EF 55-60%, no rwma. Nl RV fxn/pressure. Mildly dil RA; b. 02/2018 Lexiscan MV: EF >65%. No ischemia/infarct.  Marland Kitchen GERD (gastroesophageal reflux disease)    Past Surgical History:  Procedure Laterality Date  . left shoulder repair Left   . right knee ligament Right     Allergies  No Known Allergies  History of Present Illness    53 year old male with a history of GERD who was recently admitted to [facility] with a 5-day history of intermittent chest pain and dizziness/presyncope.  Pain was somewhat constant during hospitalization and despite this, cardiac markers were negative.  Echo showed normal LV function without regional wall motion abnormalities.  Stress testing was undertaken and was nonischemic.  Our team recommended empiric treatment for musculoskeletal pain plus minus GERD.  Regarding dizziness, no significant arrhythmias were noted during hospitalization and orthostatic vital signs were normal.  It was felt that he would require treatment for vertigo.  He presents with his wife and an interpreter today. 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  Overall, this occurs less frequently and is without associated symptoms.  Regarding dizziness, he continues to experience mild lightheadedness especially with position changes, like when he gets up in the morning out of bed, or if he turns his head very quickly.  He does have meclizine to be used as needed but symptoms have improved  quite a bit and as result, he has not been using this.  He denies palpitations, dyspnea, pnd, orthopnea, n, v, syncope, edema, weight gain, or early satiety.  Home Medications    Prior to Admission medications   Medication Sig Start Date End Date Taking? Authorizing Provider  amoxicillin-clavulanate (AUGMENTIN) 875-125 MG tablet Take 1 tablet by mouth every 12 (twelve) hours. 02/22/18   Alford Highland, MD  meclizine (ANTIVERT) 25 MG tablet Take 1 tablet (25 mg total) by mouth 3 (three) times daily as needed for dizziness. 02/22/18   Alford Highland, MD    Review of Systems    He continues to experience occasional lightheadedness/dizziness with rapid head movements or standing up too quickly.  He also notes occasional left-sided chest tenderness and soreness, especially when using his arms.  He denies palpitations, dyspnea, pnd, orthopnea, n, v, syncope, edema, weight gain, or early satiety. All other systems reviewed and are otherwise negative except as noted above.  Physical Exam    VS:  BP 118/66 (BP Location: Left Arm, Patient Position: Sitting, Cuff Size: Normal)   Pulse 75   Ht 5\' 3"  (1.6 m)   Wt 146 lb (66.2 kg)   BMI 25.86 kg/m  , BMI Body mass index is 25.86 kg/m. GEN: Well nourished, well developed, in no acute distress. HEENT: normal. Neck: Supple, no JVD, carotid bruits, or masses. Cardiac: RRR, no murmurs, rubs, or gallops. No clubbing, cyanosis, edema.  Radials/DP/PT 2+ and equal bilaterally.  Respiratory:  Respirations regular and unlabored, clear to auscultation bilaterally. GI: Soft, nontender, nondistended, BS + x 4. MS:  no deformity or atrophy. Skin: warm and dry, no rash. Neuro:  Strength and sensation are intact. Psych: Normal affect.  Accessory Clinical Findings    ECG personally reviewed by me today -regular sinus rhythm, 75- no acute changes.  Assessment & Plan    1.  Musculoskeletal chest pain: Patient with recent admission for atypical and  musculoskeletal chest pain with normal echo and negative Myoview.  He continues to have less frequent and mild left chest soreness with upper body movements.  No further cardiac work-up warranted.  2.  Dizziness/vertigo: This was also present during last hospitalization and seems to have improved though still occurs with rapid head movements or if he gets up too quickly.  He was not orthostatic by examination when seen in the hospital.  No events on monitoring.  He does have PRN meclizine but his symptoms have improved, he has not been using.  3.  Disposition: Follow-up PRN.  I, Diona Browner am acting as a Neurosurgeon for [name], NP.   I have reviewed the above documentation for accuracy and completeness, and I agree with the above.   [name], NP 03/09/2018, 10:52 AM

## 2018-03-09 ENCOUNTER — Encounter: Payer: Self-pay | Admitting: Nurse Practitioner

## 2018-03-09 ENCOUNTER — Ambulatory Visit: Payer: 59 | Admitting: Nurse Practitioner

## 2018-03-09 VITALS — BP 118/66 | HR 75 | Ht 63.0 in | Wt 146.0 lb

## 2018-03-09 DIAGNOSIS — R42 Dizziness and giddiness: Secondary | ICD-10-CM | POA: Diagnosis not present

## 2018-03-09 DIAGNOSIS — R0789 Other chest pain: Secondary | ICD-10-CM | POA: Diagnosis not present

## 2018-03-09 NOTE — Patient Instructions (Signed)
Medication Instructions:  Your physician recommends that you continue on your current medications as directed. Please refer to the Current Medication list given to you today.  If you need a refill on your cardiac medications before your next appointment, please call your pharmacy.   Lab work: None ordered  If you have labs (blood work) drawn today and your tests are completely normal, you will receive your results only by: Marland Kitchen MyChart Message (if you have MyChart) OR . A paper copy in the mail If you have any lab test that is abnormal or we need to change your treatment, we will call you to review the results.  Testing/Procedures: None ordered   Follow-Up: At [facility], you and your health needs are our priority.  As part of our continuing mission to provide you with exceptional heart care, we have created designated Provider Care Teams.  These Care Teams include your primary Cardiologist (physician) and Advanced Practice Providers (APPs -  Physician Assistants and Nurse Practitioners) who all work together to provide you with the care you need, when you need it. Follow up with Yvonne Kendall, MD as needed only.

## 2018-03-12 NOTE — Addendum Note (Signed)
Addended by: Aurelio Jew on: 03/12/2018 11:47 AM   Modules accepted: Orders

## 2018-03-19 DIAGNOSIS — Z0000 Encounter for general adult medical examination without abnormal findings: Secondary | ICD-10-CM | POA: Diagnosis not present

## 2018-03-19 DIAGNOSIS — K219 Gastro-esophageal reflux disease without esophagitis: Secondary | ICD-10-CM | POA: Diagnosis not present

## 2018-03-19 DIAGNOSIS — R5383 Other fatigue: Secondary | ICD-10-CM | POA: Diagnosis not present

## 2018-03-19 DIAGNOSIS — G43009 Migraine without aura, not intractable, without status migrainosus: Secondary | ICD-10-CM | POA: Diagnosis not present

## 2018-04-07 DIAGNOSIS — J0140 Acute pansinusitis, unspecified: Secondary | ICD-10-CM | POA: Diagnosis not present

## 2018-04-07 DIAGNOSIS — J069 Acute upper respiratory infection, unspecified: Secondary | ICD-10-CM | POA: Diagnosis not present

## 2018-05-15 DIAGNOSIS — L237 Allergic contact dermatitis due to plants, except food: Secondary | ICD-10-CM | POA: Diagnosis not present

## 2019-05-30 ENCOUNTER — Other Ambulatory Visit: Payer: Self-pay

## 2019-05-30 ENCOUNTER — Emergency Department: Payer: 59

## 2019-05-30 ENCOUNTER — Emergency Department
Admission: EM | Admit: 2019-05-30 | Discharge: 2019-05-30 | Disposition: A | Discharge status: Home / Self Care | Payer: 59 | Attending: Emergency Medicine | Admitting: Emergency Medicine

## 2019-05-30 DIAGNOSIS — R109 Unspecified abdominal pain: Secondary | ICD-10-CM

## 2019-05-30 DIAGNOSIS — N200 Calculus of kidney: Secondary | ICD-10-CM | POA: Insufficient documentation

## 2019-05-30 LAB — COMPREHENSIVE METABOLIC PANEL
ALT: 26 U/L (ref 0–44)
AST: 21 U/L (ref 15–41)
Albumin: 4.4 g/dL (ref 3.5–5.0)
Alkaline Phosphatase: 69 U/L (ref 38–126)
Anion gap: 6 (ref 5–15)
BUN: 16 mg/dL (ref 6–20)
CO2: 27 mmol/L (ref 22–32)
Calcium: 8.9 mg/dL (ref 8.9–10.3)
Chloride: 106 mmol/L (ref 98–111)
Creatinine, Ser: 0.98 mg/dL (ref 0.61–1.24)
GFR calc Af Amer: >60 mL/min (ref >60)
GFR calc non Af Amer: >60 mL/min (ref >60)
Glucose, Bld: 131 mg/dL — ABNORMAL HIGH (ref 70–99)
Potassium: 3.8 mmol/L (ref 3.5–5.1)
Sodium: 139 mmol/L (ref 135–145)
Total Bilirubin: 0.7 mg/dL (ref 0.3–1.2)
Total Protein: 7.3 g/dL (ref 6.5–8.1)

## 2019-05-30 LAB — CBC
HCT: 41.9 % (ref 39.0–52.0)
Hemoglobin: 14 g/dL (ref 13.0–17.0)
MCH: 29.1 pg (ref 26.0–34.0)
MCHC: 33.4 g/dL (ref 30.0–36.0)
MCV: 87.1 fL (ref 80.0–100.0)
Platelets: 263 10*3/uL (ref 150–400)
RBC: 4.81 MIL/uL (ref 4.22–5.81)
RDW: 13.7 % (ref 11.5–15.5)
WBC: 11.5 10*3/uL — ABNORMAL HIGH (ref 4.0–10.5)
nRBC: 0 % (ref 0.0–0.2)

## 2019-05-30 LAB — URINALYSIS, COMPLETE (UACMP) WITH MICROSCOPIC
Bacteria, UA: NONE SEEN
Bilirubin Urine: NEGATIVE
Glucose, UA: NEGATIVE mg/dL
Ketones, ur: NEGATIVE mg/dL
Leukocytes,Ua: NEGATIVE
Nitrite: NEGATIVE
Protein, ur: 30 mg/dL — AB
Specific Gravity, Urine: 1.029 (ref 1.005–1.030)
Squamous Epithelial / HPF: NONE SEEN (ref 0–5)
pH: 5 (ref 5.0–8.0)

## 2019-05-30 MED ORDER — ONDANSETRON 4 MG PO TBDP
4.0000 mg | ORAL_TABLET | Freq: Three times a day (TID) | ORAL | 0 refills | Status: AC | PRN
Start: 2019-05-30 — End: ?

## 2019-05-30 MED ORDER — ONDANSETRON 4 MG PO TBDP
4.0000 mg | ORAL_TABLET | Freq: Once | ORAL | Status: AC
  Administered 2019-05-30: 4 mg via ORAL
  Filled 2019-05-30: qty 1

## 2019-05-30 MED ORDER — OXYCODONE-ACETAMINOPHEN 5-325 MG PO TABS
1.0000 | ORAL_TABLET | Freq: Once | ORAL | Status: AC
  Administered 2019-05-30: 1 via ORAL
  Filled 2019-05-30: qty 1

## 2019-05-30 MED ORDER — HYDROCODONE-ACETAMINOPHEN 5-325 MG PO TABS: 1.0000 | ORAL_TABLET | Freq: Four times a day (QID) | ORAL | 0 refills | Status: AC | PRN

## 2019-05-30 NOTE — Discharge Instructions (Addendum)
Follow-up with your primary care provider if any continued problems or concerns.  Increase fluids today.  Also a prescription for Zofran as needed for nausea and hydrocodone was sent to the pharmacy if needed for pain.  You cannot take the pain medication and drive or operate machinery.  Increase fluids.  Read the information about low purine diet which may decrease the formation of kidney stones.  You have very small kidney stone inside the left kidney which will not cause problems unless it breaks off and begins traveling down to your bladder.

## 2019-05-30 NOTE — ED Triage Notes (Signed)
Pt in with co left flank pain that started yesterday, no dysuria or fever. Denies any hx of the same, no n.v.d.

## 2019-05-30 NOTE — ED Provider Notes (Signed)
[facility] Emergency Department Provider Note  ____________________________________________   First MD Initiated Contact with Patient 05/30/19 [phone]     (approximate)  I have reviewed the triage vital signs and the nursing notes.   HISTORY  Chief Complaint Abdominal Pain   HPI Russell Flynn is a 54 y.o. male Mod[place] Jansky to the ED with complaint of left flank pain that started yesterday.  Patient denies any dysuria, fever or chills.  He does have some nausea without vomiting.  Patient denies any previous urinary tract infections or kidney stones.  Patient describes his left flank area hurting and then radiating around into the groin area.  He denies any gross hematuria.  He rates his pain as 7 out of 10.       Past Medical History:  Diagnosis Date  . Atypical chest pain    a. 02/2018 Echo: EF 55-60%, no rwma. Nl RV fxn/pressure. Mildly dil RA; b. 02/2018 Lexiscan MV: EF >65%. No ischemia/infarct.  Marland Kitchen GERD (gastroesophageal reflux disease)     Patient Active Problem List   Diagnosis Date Noted  . Chest pain 02/21/2018    Past Surgical History:  Procedure Laterality Date  . left shoulder repair Left   . right knee ligament Right     Prior to Admission medications   Medication Sig Start Date End Date Taking? Authorizing Provider  HYDROcodone-acetaminophen (NORCO/VICODIN) 5-325 MG tablet Take 1 tablet by mouth every 6 (six) hours as needed for moderate pain. 05/30/19   Tommi Rumps, PA-C  ondansetron (ZOFRAN ODT) 4 MG disintegrating tablet Take 1 tablet (4 mg total) by mouth every 8 (eight) hours as needed for nausea or vomiting. 05/30/19   Tommi Rumps, PA-C    Allergies Patient has no known allergies.  Family History  Problem Relation Age of Onset  . Heart disease Mother        Thayer Jew in the heart requiring surigal repair.  Marland Kitchen Hypertension Father     Social History Social History   Tobacco Use  . Smoking status: Never Smoker  . Smokeless  tobacco: Never Used  Substance Use Topics  . Alcohol use: Yes    Comment: 2 glasses of wine per month  . Drug use: Never    Review of Systems Constitutional: No fever/chills Eyes: No visual changes. ENT: No sore throat. Cardiovascular: Denies chest pain. Respiratory: Denies shortness of breath. Gastrointestinal: No abdominal pain.  Positive nausea, no vomiting.   Genitourinary: Negative for dysuria.  Positive left flank pain. Musculoskeletal: Negative for muscle skeletal pain. Skin: Negative for rash. Neurological: Negative for headaches, focal weakness or numbness. ____________________________________________   PHYSICAL EXAM:  VITAL SIGNS: ED Triage Vitals  Enc Vitals Group     BP 05/30/19 0238 130/79     Pulse Rate 05/30/19 0238 70     Resp 05/30/19 0238 20     Temp 05/30/19 0238 98.7 F (37.1 C)     Temp Source 05/30/19 0238 Oral     SpO2 05/30/19 0238 98 %     Weight 05/30/19 0239 150 lb (68 kg)     Height 05/30/19 0239 5\' 3"  (1.6 m)     Head Circumference --      Peak Flow --      Pain Score 05/30/19 0238 7     Pain Loc --      Pain Edu? --      Excl. in GC? --     Constitutional: Alert and oriented. Well appearing  and in no acute distress. Eyes: Conjunctivae are normal.  Head: Atraumatic. Neck: No stridor.   Cardiovascular: Normal rate, regular rhythm. Grossly normal heart sounds.  Good peripheral circulation. Respiratory: Normal respiratory effort.  No retractions. Lungs CTAB. Gastrointestinal: Soft and nontender. No distention.  Bowel sounds normoactive x4 quadrants.  No CVA tenderness at present. Musculoskeletal: Moves upper and lower extremities with any difficulty normal gait was noted. Neurologic:  Normal speech and language. No gross focal neurologic deficits are appreciated.  Skin:  Skin is warm, dry and intact.  Psychiatric: Mood and affect are normal. Speech and behavior are normal.  ____________________________________________   LABS (all  labs ordered are listed, but only abnormal results are displayed)  Labs Reviewed  CBC - Abnormal; Notable for the following components:      Result Value   WBC 11.5 (*)    All other components within normal limits  COMPREHENSIVE METABOLIC PANEL - Abnormal; Notable for the following components:   Glucose, Bld 131 (*)    All other components within normal limits  URINALYSIS, COMPLETE (UACMP) WITH MICROSCOPIC - Abnormal; Notable for the following components:   Color, Urine YELLOW (*)    APPearance CLOUDY (*)    Hgb urine dipstick MODERATE (*)    Protein, ur 30 (*)    All other components within normal limits    RADIOLOGY   Official radiology report(s): CT Renal Stone Study  Result Date: 05/30/2019 CLINICAL DATA:  Flank pain on the left at started yesterday. EXAM: CT ABDOMEN AND PELVIS WITHOUT CONTRAST TECHNIQUE: Multidetector CT imaging of the abdomen and pelvis was performed following the standard protocol without IV contrast. COMPARISON:  None. FINDINGS: Lower chest:  No contributory findings. Hepatobiliary: 2 small cystic densities in the central and right lobe liver.No evidence of biliary obstruction or stone. Pancreas: Unremarkable. Spleen: Unremarkable. Adrenals/Urinary Tract: Negative adrenals. No hydronephrosis or ureteral stone. 11 mm cystic density at the lower pole right kidney. There is a punctate calculus in the lower pole left kidney. A 2 mm calculus is seen in the dependent bladder. Stomach/Bowel:  No obstruction. No evidence of bowel inflammation. Vascular/Lymphatic: No acute vascular abnormality. No mass or adenopathy. Reproductive:Nonspecific, dystrophic prostate calcifications. Other: No ascites or pneumoperitoneum. Small fatty right inguinal hernia. Musculoskeletal: No acute abnormalities. IMPRESSION: 1. 2 mm stone in the bladder, possibly recently passed given the history. No hydronephrosis or ureteral stone currently. 2. Small left lower pole renal calculus. 3. Fatty right  inguinal hernia. Electronically Signed   By: Monte Fantasia M.D.   On: 05/30/2019 06:02    ____________________________________________   PROCEDURES  Procedure(s) performed (including Critical Care):  Procedures   ____________________________________________   INITIAL IMPRESSION / ASSESSMENT AND PLAN / ED COURSE  As part of my medical decision making, I reviewed the following data within the electronic MEDICAL RECORD NUMBER Notes from prior ED visits and [place] Controlled Substance Database  54 year old male presents to the ED with complaint of left flank pain that started yesterday.  He reports nausea but without vomiting, fever or chills.  Patient states he has not had a UTI or previous kidney stones.  Urinalysis was cloudy with 0-5 RBCs and 0-5 WBCs.  CT scan was positive for renal calculi on the left side and 1 small 2 mm stone in the bladder which most likely was recently passed.  Patient had relief with Zofran and Percocet while in the ED.  A prescription for Zofran and hydrocodone was sent to his pharmacy.  He is encouraged to  increase fluids.  He will follow-up with his PCP if any continued problems or concerns.  ____________________________________________   FINAL CLINICAL IMPRESSION(S) / ED DIAGNOSES  Final diagnoses:  Kidney stone  Acute left flank pain     ED Discharge Orders         Ordered    ondansetron (ZOFRAN ODT) 4 MG disintegrating tablet  Every 8 hours PRN     05/30/19 0828    HYDROcodone-acetaminophen (NORCO/VICODIN) 5-325 MG tablet  Every 6 hours PRN     05/30/19 4383           Note:  This document was prepared using Dragon voice recognition software and may include unintentional dictation errors.    Tommi Rumps, PA-C 05/30/19 0849    Concha Se, MD 05/30/19 0900

## 2020-12-29 IMAGING — CR DG CHEST 2V
2 series · 2 of 2 positions shown · non-contrast
Comparison: None.

CLINICAL DATA: Patient with complaints of generalized chest
pressure x 3 days, developing some associated dizziness last night
and again today. Pt also reports some blurred vision upon waking
this morning which has not resolved.

EXAM:
CHEST - 2 VIEW

[chest pa]
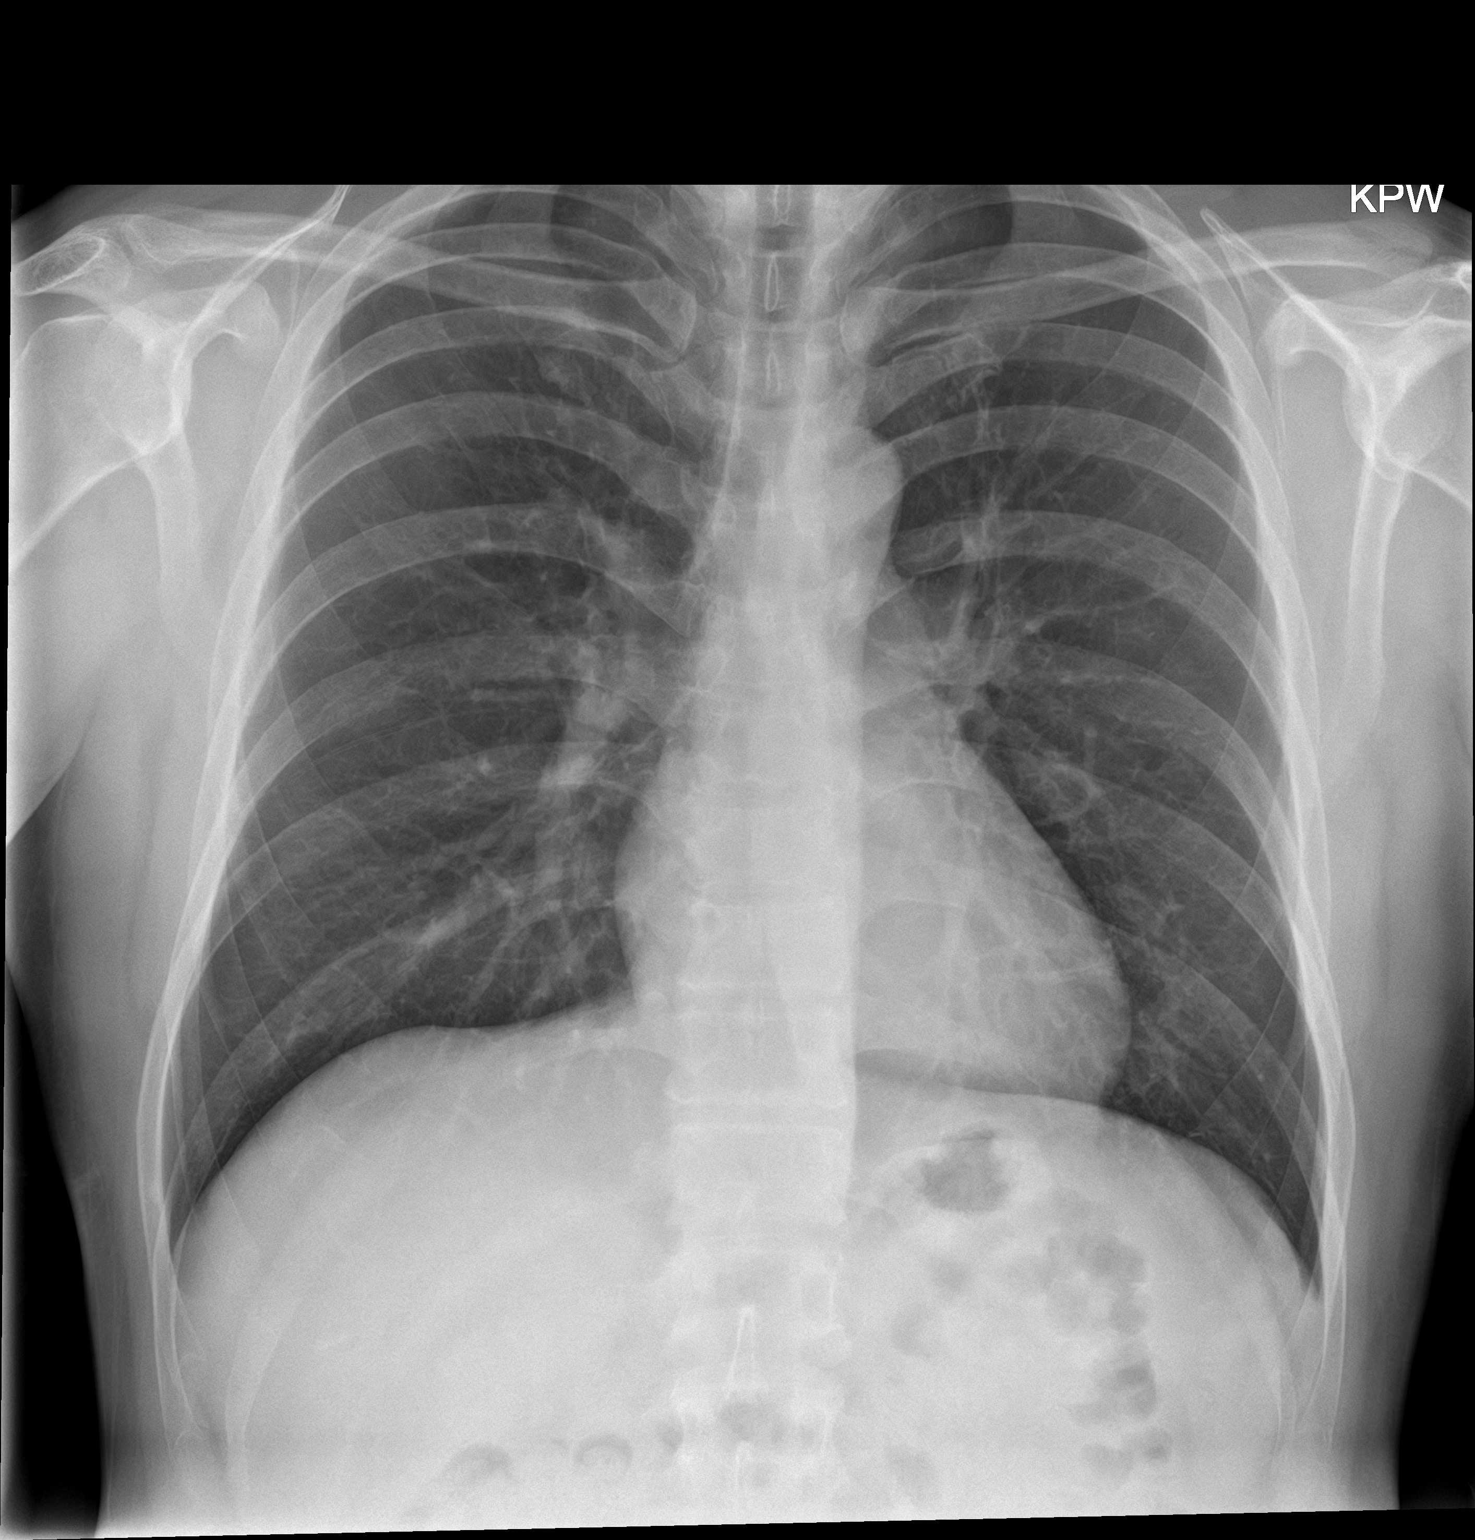

[chest lat]
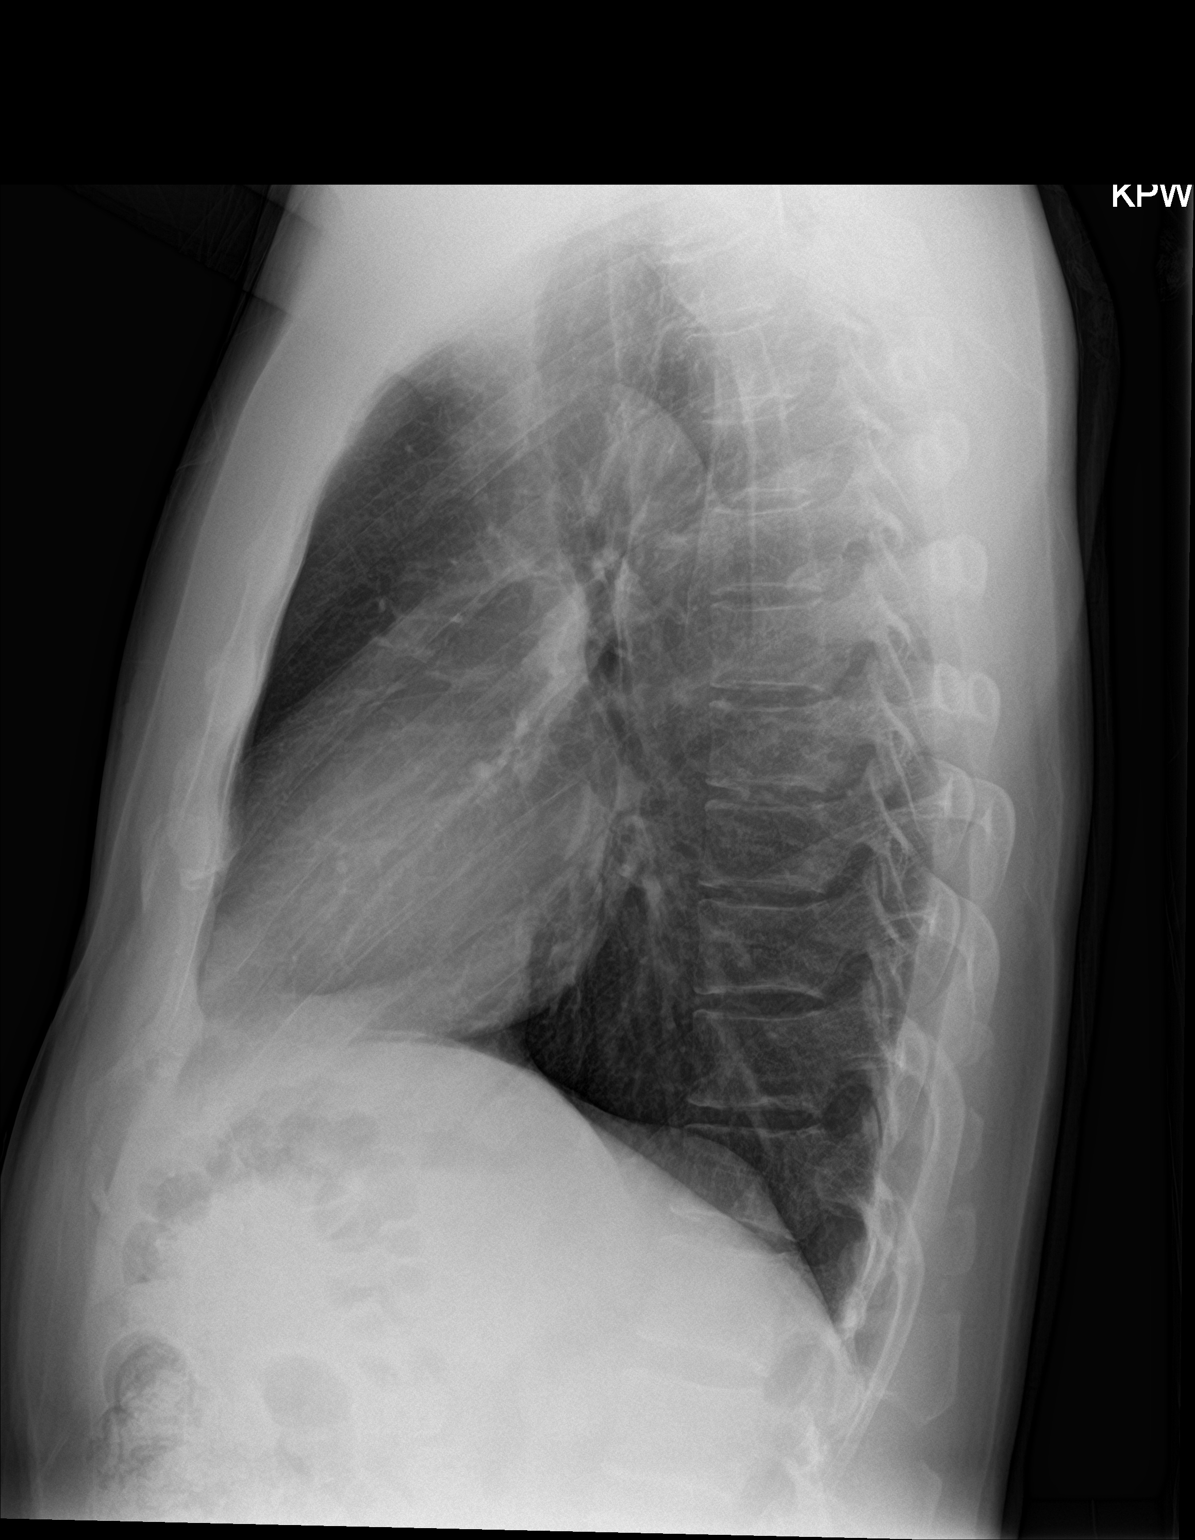

[2 of 2 positions shown; findings below may reference images not displayed]

FINDINGS: The heart size and mediastinal contours are within normal limits.
Both lungs are clear. The visualized skeletal structures are
unremarkable.
IMPRESSION: No active cardiopulmonary disease.

## 2022-04-06 IMAGING — CT CT RENAL STONE PROTOCOL
2 of 4 series · 16 of 46 positions shown, 18 images · non-contrast
Comparison: None.

CLINICAL DATA: Flank pain on the left at started yesterday.

EXAM:
CT ABDOMEN AND PELVIS WITHOUT CONTRAST
TECHNIQUE: Multidetector CT imaging of the abdomen and pelvis was performed
following the standard protocol without IV contrast.

[Series 2: stone full standard · axial · 0.70mm/px · z∈[-880,-460]mm · 13 of 92 slices shown, 15 images]
[im 4/92  soft-tissue]
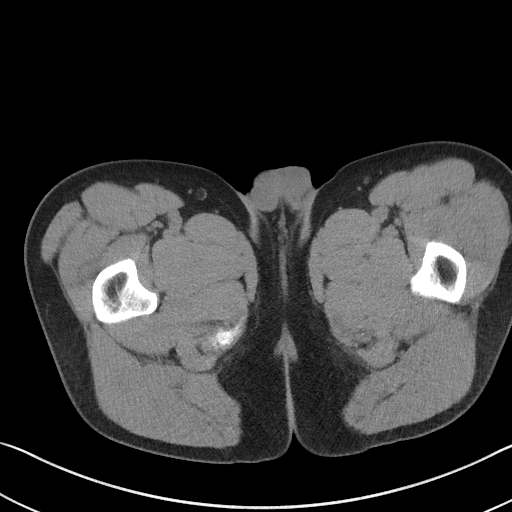
[im 4/92  bone]
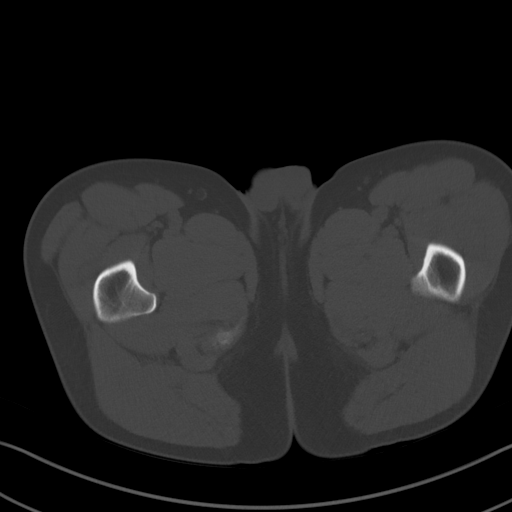
[im 11/92  soft-tissue]
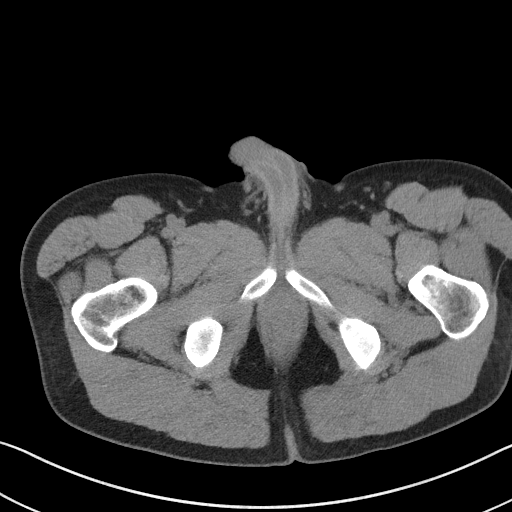
[im 18/92  soft-tissue]
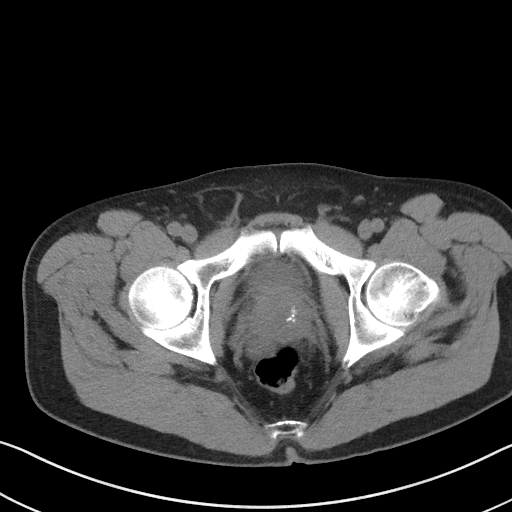
[im 25/92  soft-tissue]
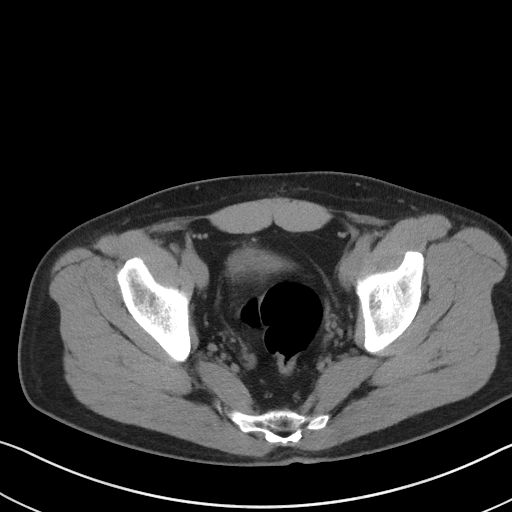
[im 32/92  soft-tissue]
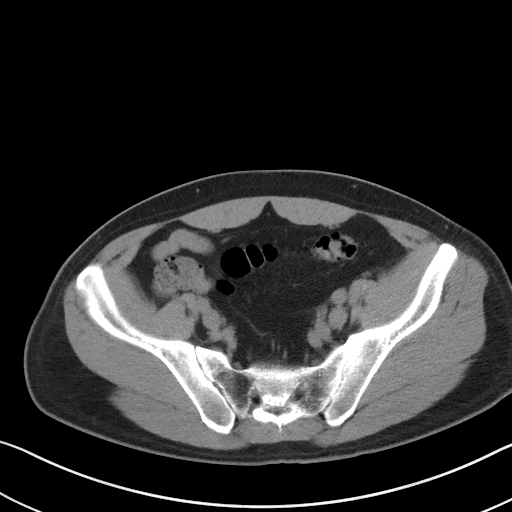
[im 39/92  soft-tissue]
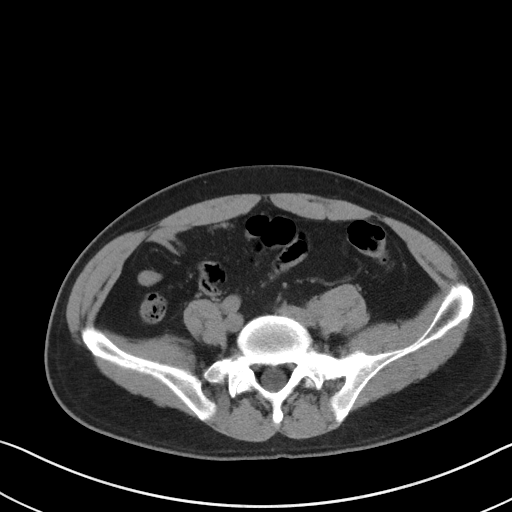
[im 46/92  soft-tissue]
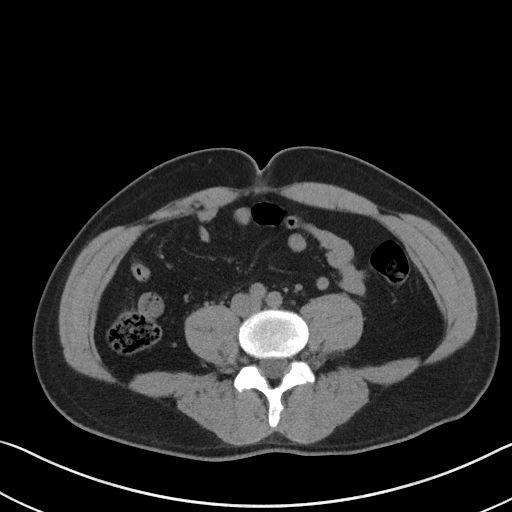
[im 53/92  soft-tissue]
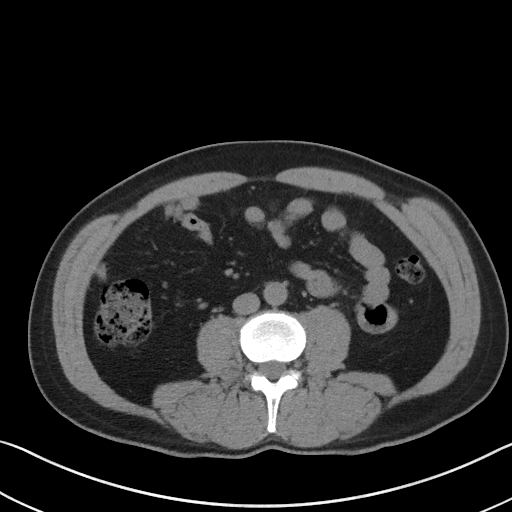
[im 60/92  soft-tissue]
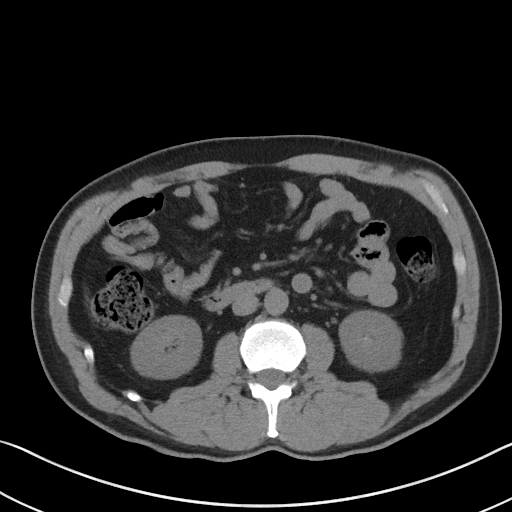
[im 60/92  bone]
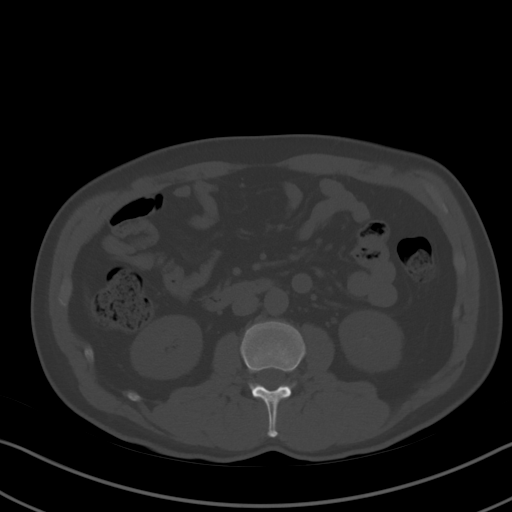
[im 67/92  soft-tissue]
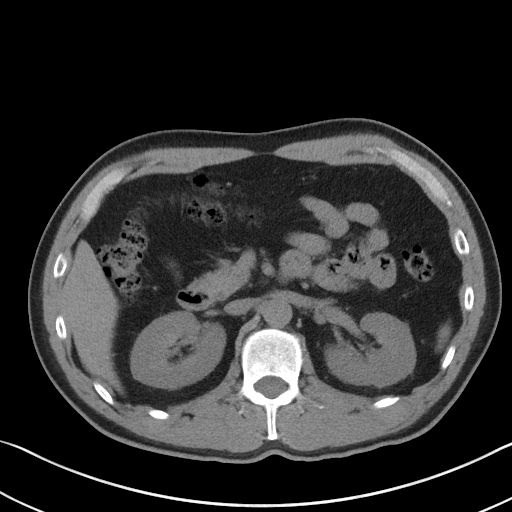
[im 74/92  soft-tissue]
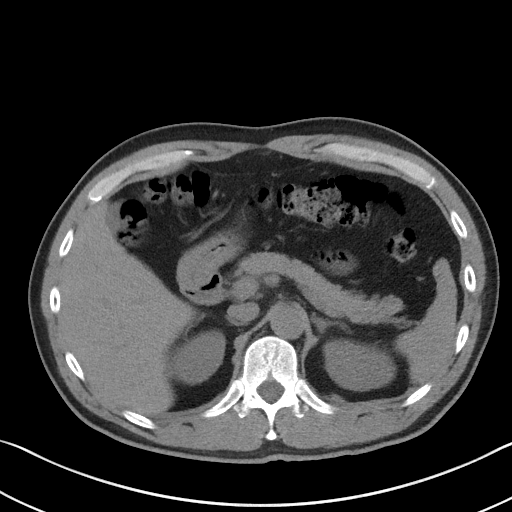
[im 81/92  soft-tissue]
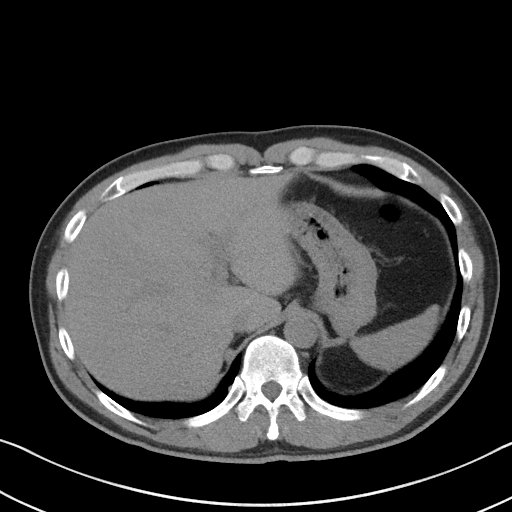
[im 88/92  soft-tissue]
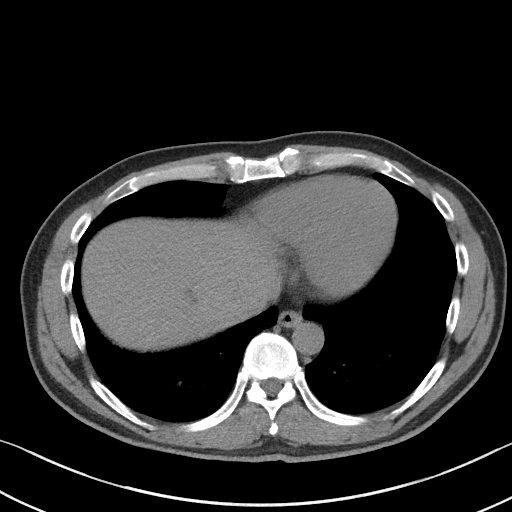

[Series 5: coronal · coronal · 0.69mm/px · 3 of 112 slices shown]
[im 38/112  soft-tissue]
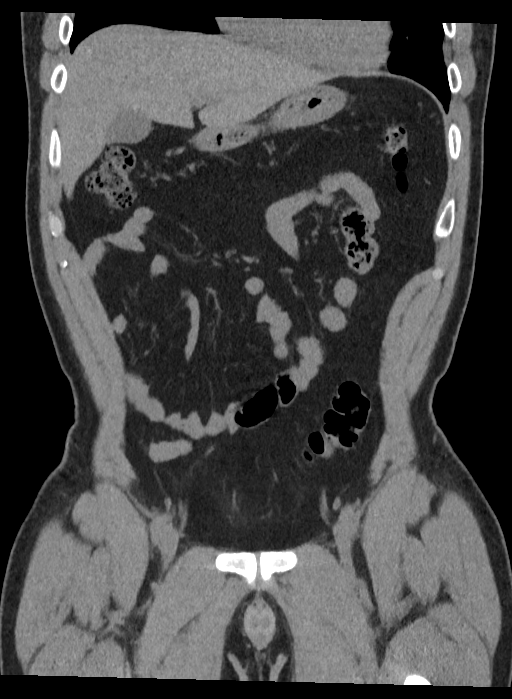
[im 50/112  soft-tissue]
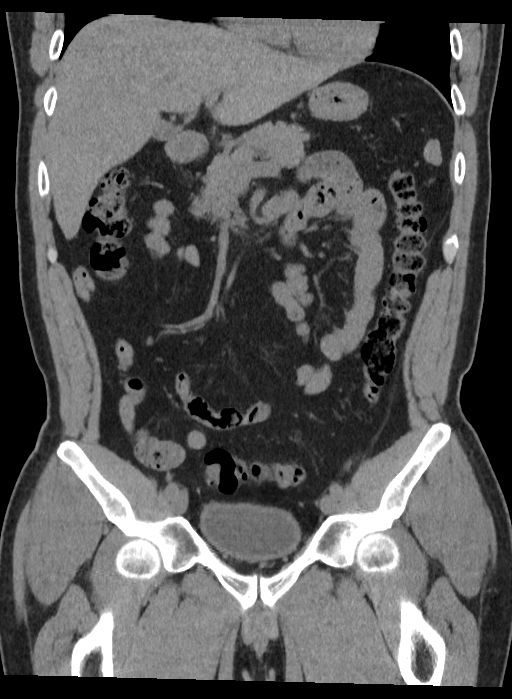
[im 62/112  soft-tissue]
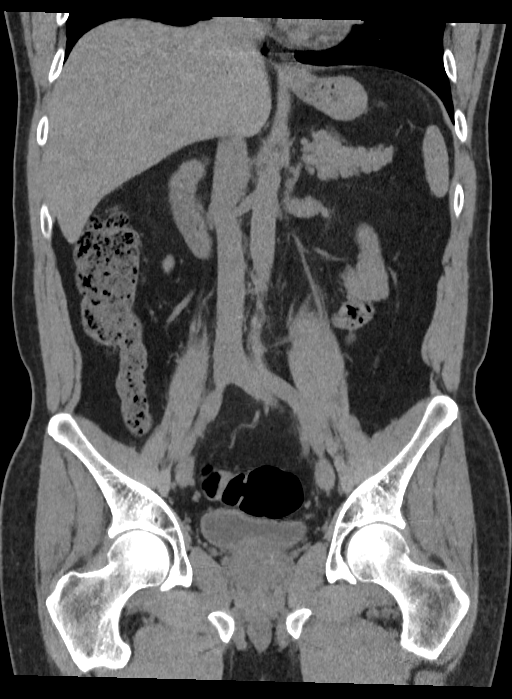

[16 of 46 positions shown; findings below may reference images not displayed]

FINDINGS: Lower chest:  No contributory findings.

Hepatobiliary: 2 small cystic densities in the central and right
lobe liver.No evidence of biliary obstruction or stone.

Pancreas: Unremarkable.

Spleen: Unremarkable.

Adrenals/Urinary Tract: Negative adrenals. No hydronephrosis or
ureteral stone. 11 mm cystic density at the lower pole right kidney.
There is a punctate calculus in the lower pole left kidney. A 2 mm
calculus is seen in the dependent bladder.

Stomach/Bowel:  No obstruction. No evidence of bowel inflammation.

Vascular/Lymphatic: No acute vascular abnormality. No mass or
adenopathy.

Reproductive:Nonspecific, dystrophic prostate calcifications.

Other: No ascites or pneumoperitoneum. Small fatty right inguinal
hernia.

Musculoskeletal: No acute abnormalities.
IMPRESSION: 1. 2 mm stone in the bladder, possibly recently passed given the
history. No hydronephrosis or ureteral stone currently.
2. Small left lower pole renal calculus.
3. Fatty right inguinal hernia.
